# Patient Record
Sex: Male | Born: 1955 | Race: Black or African American | Hispanic: No | Marital: Married | Smoking: Never smoker
Health system: Southern US, Community
[De-identification: ages and names within clinical notes are randomized; demographics above are authoritative.]

## PROBLEM LIST (undated history)

## (undated) ENCOUNTER — Emergency Department (HOSPITAL_COMMUNITY): Admission: EM | Payer: Self-pay | Source: Home / Self Care

---

## 1998-08-27 ENCOUNTER — Encounter: Payer: Self-pay | Admitting: Emergency Medicine

## 1998-08-27 ENCOUNTER — Emergency Department (HOSPITAL_COMMUNITY): Admission: EM | Admit: 1998-08-27 | Discharge: 1998-08-27 | Payer: Self-pay | Admitting: Emergency Medicine

## 1998-08-29 ENCOUNTER — Emergency Department (HOSPITAL_COMMUNITY): Admission: EM | Admit: 1998-08-29 | Discharge: 1998-08-29 | Payer: Self-pay

## 1998-09-05 ENCOUNTER — Emergency Department (HOSPITAL_COMMUNITY): Admission: EM | Admit: 1998-09-05 | Discharge: 1998-09-05 | Payer: Self-pay | Admitting: Emergency Medicine

## 2009-01-07 ENCOUNTER — Emergency Department (HOSPITAL_COMMUNITY): Admission: EM | Admit: 2009-01-07 | Discharge: 2009-01-07 | Payer: Self-pay | Admitting: Emergency Medicine

## 2009-01-18 ENCOUNTER — Encounter: Admission: RE | Admit: 2009-01-18 | Discharge: 2009-01-18 | Payer: Self-pay | Admitting: Chiropractic Medicine

## 2009-04-18 ENCOUNTER — Ambulatory Visit (HOSPITAL_COMMUNITY): Admission: RE | Admit: 2009-04-18 | Discharge: 2009-04-18 | Payer: Self-pay | Admitting: Chiropractic Medicine
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
## 2014-09-23 ENCOUNTER — Encounter (HOSPITAL_COMMUNITY): Payer: Self-pay | Admitting: *Deleted

## 2014-09-23 ENCOUNTER — Emergency Department (HOSPITAL_COMMUNITY)
Admission: EM | Admit: 2014-09-23 | Discharge: 2014-09-23 | Disposition: A | Discharge status: Home / Self Care | Payer: Self-pay | Attending: Emergency Medicine | Admitting: Emergency Medicine

## 2014-09-23 DIAGNOSIS — Y92410 Unspecified street and highway as the place of occurrence of the external cause: Secondary | ICD-10-CM | POA: Insufficient documentation

## 2014-09-23 DIAGNOSIS — Y9389 Activity, other specified: Secondary | ICD-10-CM | POA: Insufficient documentation

## 2014-09-23 DIAGNOSIS — S8992XA Unspecified injury of left lower leg, initial encounter: Secondary | ICD-10-CM | POA: Insufficient documentation

## 2014-09-23 DIAGNOSIS — S0990XA Unspecified injury of head, initial encounter: Secondary | ICD-10-CM | POA: Insufficient documentation

## 2014-09-23 DIAGNOSIS — Y998 Other external cause status: Secondary | ICD-10-CM | POA: Insufficient documentation

## 2014-09-23 DIAGNOSIS — M25462 Effusion, left knee: Secondary | ICD-10-CM

## 2014-09-23 MED ORDER — METHOCARBAMOL 500 MG PO TABS: 500.0000 mg | ORAL_TABLET | Freq: Two times a day (BID) | ORAL | Status: AC

## 2014-09-23 MED ORDER — NAPROXEN 500 MG PO TABS
500.0000 mg | ORAL_TABLET | Freq: Once | ORAL | Status: AC
  Administered 2014-09-23: 500 mg via ORAL
  Filled 2014-09-23: qty 1

## 2014-09-23 MED ORDER — NAPROXEN 500 MG PO TABS: 500.0000 mg | ORAL_TABLET | Freq: Two times a day (BID) | ORAL | Status: AC

## 2014-09-23 NOTE — Discharge Instructions (Signed)
Knee Effusion Follow up with a primary care provider using the resource guide below.  Rest, Ice, and elevate.  The medical term for having fluid in your knee is effusion. This is often due to an internal derangement of the knee. This means something is wrong inside the knee. Some of the causes of fluid in the knee may be torn cartilage, a torn ligament, or bleeding into the joint from an injury. Your knee is likely more difficult to bend and move. This is often because there is increased pain and pressure in the joint. The time it takes for recovery from a knee effusion depends on different factors, including:   Type of injury.  Your age.  Physical and medical conditions.  Rehabilitation Strategies. How long you will be away from your normal activities will depend on what kind of knee problem you have and how much damage is present. Your knee has two types of cartilage. Articular cartilage covers the bone ends and lets your knee bend and move smoothly. Two menisci, thick pads of cartilage that form a rim inside the joint, help absorb shock and stabilize your knee. Ligaments bind the bones together and support your knee joint. Muscles move the joint, help support your knee, and take stress off the joint itself. CAUSES  Often an effusion in the knee is caused by an injury to one of the menisci. This is often a tear in the cartilage. Recovery after a meniscus injury depends on how much meniscus is damaged and whether you have damaged other knee tissue. Small tears may heal on their own with conservative treatment. Conservative means rest, limited weight bearing activity and muscle strengthening exercises. Your recovery may take up to 6 weeks.  TREATMENT  Larger tears may require surgery. Meniscus injuries may be treated during arthroscopy. Arthroscopy is a procedure in which your surgeon uses a small telescope like instrument to look in your knee. Your caregiver can make a more accurate diagnosis  (learning what is wrong) by performing an arthroscopic procedure. If your injury is on the inner margin of the meniscus, your surgeon may trim the meniscus back to a smooth rim. In other cases your surgeon will try to repair a damaged meniscus with stitches (sutures). This may make rehabilitation take longer, but may provide better long term result by helping your knee keep its shock absorption capabilities. Ligaments which are completely torn usually require surgery for repair. HOME CARE INSTRUCTIONS  Use crutches as instructed.  If a brace is applied, use as directed.  Once you are home, an ice pack applied to your swollen knee may help with discomfort and help decrease swelling.  Keep your knee raised (elevated) when you are not up and around or on crutches.  Only take over-the-counter or prescription medicines for pain, discomfort, or fever as directed by your caregiver.  Your caregivers will help with instructions for rehabilitation of your knee. This often includes strengthening exercises.  You may resume a normal diet and activities as directed. SEEK MEDICAL CARE IF:   There is increased swelling in your knee.  You notice redness, swelling, or increasing pain in your knee.  An unexplained oral temperature above 102 F (38.9 C) develops. SEEK IMMEDIATE MEDICAL CARE IF:   You develop a rash.  You have difficulty breathing.  You have any allergic reactions from medications you may have been given.  There is severe pain with any motion of the knee. MAKE SURE YOU:   Understand these instructions.  Will  watch your condition.  Will get help right away if you are not doing well or get worse. Document Released: 04/14/2003 Document Revised: 04/16/2011 Document Reviewed: 06/18/2007 [facility] Patient Information 2015 [place], [place]. This information is not intended to replace advice given to you by your health care provider. Make sure you discuss any questions you have with your  health care provider. Emergency Department Resource Guide 1) Find a Doctor and Pay Out of Pocket Although you won't have to find out who is covered by your insurance plan, it is a good idea to ask around and get recommendations. You will then need to call the office and see if the doctor you have chosen will accept you as a new patient and what types of options they offer for patients who are self-pay. Some doctors offer discounts or will set up payment plans for their patients who do not have insurance, but you will need to ask so you aren't surprised when you get to your appointment.  2) Contact Your Local Health Department Not all health departments have doctors that can see patients for sick visits, but many do, so it is worth a call to see if yours does. If you don't know where your local health department is, you can check in your phone book. The CDC also has a tool to help you locate your state's health department, and many state websites also have listings of all of their local health departments.  3) Find a Walk-in Clinic If your illness is not likely to be very severe or complicated, you may want to try a walk in clinic. These are popping up all over the country in pharmacies, drugstores, and shopping centers. They're usually staffed by nurse practitioners or physician assistants that have been trained to treat common illnesses and complaints. They're usually fairly quick and inexpensive. However, if you have serious medical issues or chronic medical problems, these are probably not your best option.  No Primary Care Doctor: - Call Health Connect at  [phone] - they can help you locate a primary care doctor that  accepts your insurance, provides certain services, etc. - Physician Referral Service- [phone]  Chronic Pain Problems: Organization         Address  Phone   Notes  [facility]  [phone] Patients need to be referred by their primary care doctor.    Medication Assistance: Organization         Address  Phone   Notes  [facility] Medication [facility] [address] [place] [place], [place] [address] [phone] --Must be a resident of [facility] -- Must have NO insurance coverage whatsoever (no Medicaid/ Medicare, etc.) -- The pt. MUST have a primary care doctor that directs their care regularly and follows them in the community   [facility]  [phone]   [employer]  [phone]    Agencies that provide inexpensive medical care: Organization         Address  Phone   Notes  [facility]  [phone]   [facility]    [phone]   [facility] [address] [place], [place] [address] [phone]   [facility] [address] [place], [place] [phone]   Planned Parenthood    [phone]   [facility]    [phone]   [facility]  [address] [place], [employer] Phone:  [phone], Fax:  [phone] Hours of Operation:  9 am - 6 pm, M-F.  Also accepts Medicaid/Medicare and self-pay.  [facility]  [address], [place] Phone: [phone], Fax: [phone]. Hours of Operation:  8:30 am - 5:30 pm, M-F.  Also accepts Medicaid and self-pay.  [facility] [place] [address] [place] Phone: [phone]   [facility] [address] Natasha Bence [place], [place] [phone], Ext. [phone] Mondays & Thursdays: 7-9 AM.  First 15 patients are seen on a first come, first serve basis.    Medicaid-accepting [facility] Providers:  Organization         Address  Phone   Notes  [facility] [address], [place] [phone] Also accepts self-pay patients.  [facility] [address] Laurell Josephs [place], [place]  [phone]   [facility] [address], [place] [phone]   [facility] [address], [place] [phone]   Renaye Rakers [address], [place]   [phone] Only accepts [place] patients after they have their name applied to their card.   Self-Pay (no insurance) in [facility]:  Organization         Address  Phone   Notes  Sickle Cell Patients, [facility] [address] [place], [place] [phone]   [facility] [address] [place], [place] [phone]   [facility] [place]  [address], [place] [phone]   [facility]/Dr. Osei-Bonsu  [address], [place] or [address], [place] [phone] Phone number for both [place] and [place] locations is the same.  [facility]gent Medical and [facility] [address], [place] [phone]   [facility] [address] [place] or [address] [phone]   [facility] [address] [place] [phone], phone; [phone], fax Sees patients 1st and 3rd Saturday of every month.  Must not qualify for public or private insurance (i.e. Medicaid, Medicare, [place] Health Choice, Veterans' Benefits)  Household income should be no more than 200% of the poverty level The clinic cannot treat you if you are pregnant or think you are pregnant  Sexually transmitted diseases are not treated at the clinic.    Dental Care: Organization         Address  Phone  Notes  [facility] Department of [facility] [address] [place], [place] [phone] Accepts children up to age 79 who are enrolled in [place] or [place] Health Choice; pregnant women with a Medicaid card; and children who have applied for Medicaid or [place] Health Choice, but were declined, whose parents can pay a reduced fee at time of service.    [facility] Department of [facility]  [address], [place] [phone] Accepts children up to age 19 who are enrolled in [place] or [place] Health Choice; pregnant women with a Medicaid card; and children who have applied for Medicaid or [place] Health Choice, but were declined, whose parents can pay a reduced fee at time of service.  [facility]  [address] [place], [place] [phone] Patients are seen by appointment only.  Walk-ins are not accepted. [facility] will see patients 59 years of age and older. Monday - Tuesday (8am-5pm) Most Wednesdays (8:30-5pm) $30 per visit, cash only  [facility] Adult Dental Access PROGRAM  [address] [facility] [phone] Patients are seen by appointment only. Walk-ins are not accepted. [facility] will see patients 24 years of age and older. One Wednesday Evening (Monthly: Volunteer Based).  $30 per visit, cash only  [employer]  [phone] for adults; Children under age 41, call Graduate Pediatric Dentistry at [phone]. Children aged 47-14, please call [phone] to request a pediatric application.  Dental services are provided in all areas of dental care including fillings, crowns and bridges, complete and partial dentures, implants, gum treatment, root canals, and extractions. Preventive care is also provided. Treatment is provided to both adults and children. Patients are selected via a lottery and there is often a waiting list.   [facility] [address], [place]  [phone] [email or web address]   [facility] [address] [place], [place] [phone], Ext. [phone] Second and Fourth Thursday of each month, opens at 6:30 AM; Clinic ends at 9 AM.  Patients are seen on a first-come first-served basis, and a limited number are seen during each clinic.   [facility]  [address] Ether Griffins [place], [place] [phone]   Eligibility Requirements You must have lived in [place], [place], or [place] counties for at least the last three months.   You cannot be eligible for state or federal sponsored [place], including [employer], [place], or [employer].   You generally cannot be eligible for healthcare insurance through your employer.    How to apply: Eligibility screenings are held every Tuesday and Wednesday afternoon from 1:00 pm until 4:00 pm. You do not need an appointment for the interview!  [facility] [address], [place], [place] [phone]   [facility] Health Department  [phone]   [facility] Health Department  [phone]   [facility] Health Department  [phone]    Behavioral Health Resources in the Community: Intensive Outpatient Programs Organization         Address  Phone  Notes  [facility] [address], [place], [place] [phone]   [facility] Outpatient [address], [place], [place] [phone]   [facility] [address] [place], [place]  [phone]   [facility] Mental Health [address],  [place], [place] [phone] or [phone]   Substance Abuse Resources Organization         Address  Phone  Notes  [facility]  [phone]   [facility]  [phone]   [facility]  [phone]   Floydene Flock  [phone]   Residential & Outpatient Substance Abuse Program  [phone]   Psychological Services Organization         Address  Phone  Notes  [facility] Behavioral Health  [phone]   [facility]  [phone]   [facility] Mental Health [address] [place] [phone] or [phone]    Mobile Crisis Teams Organization         Address  Phone  Notes  [facility], Mobile Crisis Care Unit  [phone]   [facility]  [address] [place], [place] [phone]   [facility] [address] [facility] [place] [phone]  Self-Help/Support Groups Organization         Address  Phone             Notes  [facility] [place] - variety of support groups  [place] Call for more information  Narcotics Anonymous (NA), [facility] [address], [employer] [place]  2 meetings at this location   Special educational needs teacher         Address  Phone  Notes  [facility] [place],    [place]  [phone]   [facility]  [address] [place] [phone], [place], [place]   [place], [place] [phone] Admissions: 8am-3pm M-F  Incentives Substance [place] [address]    [place], [place] [phone]   [facility] [address] [place], [place], [place]   [facility] [address]  [place], [place]   [facility] - Intensive Outpatient [address], [place], [place]   [facility] ([place]  [place], [place] [phone] or [phone]   [facility] ([facility]) [address] [place], [place] Accepts Medicaid  [facility] [address],  [place] [phone] Substance Abuse/Addiction Treatment   [facility] Organization         Address  Phone  Notes  [facility]  [phone]   Domenic Schwab, PhD [address] Arlis Porta [place], [place]   [phone] or [phone]   [place], [place] [phone]   [facility] [address], [place], [place] [phone] Insurance/Medicaid/sponsorship through [facility] and Families [address] [place]                                    [place], [place] [phone]  [place] [address], [place] [phone]    Dr. Adele Schilder  [phone]   [facility] Dept. 1) [address], [place] 2) [place] 3)  [address], [place] [phone]  [phone]   [place] [phone] or [phone] (After Hours)
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
## 2014-10-07 ENCOUNTER — Ambulatory Visit: Payer: Self-pay | Attending: Orthopaedic Surgery

## 2014-10-07 DIAGNOSIS — M542 Cervicalgia: Secondary | ICD-10-CM | POA: Insufficient documentation

## 2014-10-07 DIAGNOSIS — R293 Abnormal posture: Secondary | ICD-10-CM | POA: Insufficient documentation

## 2014-10-07 DIAGNOSIS — M6248 Contracture of muscle, other site: Secondary | ICD-10-CM | POA: Insufficient documentation

## 2014-10-07 DIAGNOSIS — M62838 Other muscle spasm: Secondary | ICD-10-CM

## 2014-10-07 DIAGNOSIS — M436 Torticollis: Secondary | ICD-10-CM | POA: Insufficient documentation

## 2014-10-07 DIAGNOSIS — M25562 Pain in left knee: Secondary | ICD-10-CM | POA: Insufficient documentation
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
## 2014-10-07 NOTE — Patient Instructions (Signed)

## 2014-10-13 ENCOUNTER — Ambulatory Visit: Payer: Self-pay

## 2014-10-13 DIAGNOSIS — M62838 Other muscle spasm: Secondary | ICD-10-CM

## 2014-10-13 DIAGNOSIS — M436 Torticollis: Secondary | ICD-10-CM

## 2014-10-13 DIAGNOSIS — M542 Cervicalgia: Secondary | ICD-10-CM

## 2014-10-13 DIAGNOSIS — M25562 Pain in left knee: Secondary | ICD-10-CM

## 2014-10-13 NOTE — Therapy (Signed)
[facility] Outpatient Rehabilitation [facility] [address] [place], [place], [address] Phone: [phone]   Fax:  [phone]  Physical Therapy Treatment  Patient Details  Name: Gene Glover MRN: [id number] Date of Birth: Aug 14, 1955 Referring Provider:  Kathryne Hitch*  Encounter Date: 10/13/2014      PT End of Session - 10/13/14 1327    Visit Number 2   Number of Visits 16   Date for PT Re-Evaluation 11/25/14   PT Start Time 1220   PT Stop Time 1308   PT Time Calculation (min) 48 min   Activity Tolerance Patient tolerated treatment well   Behavior During Therapy [facility] for tasks assessed/performed      No past medical history on file.  No past surgical history on file.  There were no vitals filed for this visit.  Visit Diagnosis:  Neck pain, acute  Knee pain, acute, left  Stiffness of neck  Muscle spasms of neck      Subjective Assessment - 10/13/14 1224    Subjective (p) I am in less pain than last time. Drove over 1.5 hours with less burning in thigh.    Currently in Pain? (p) Yes   Pain Score (p) 2    Pain Location (p) Knee   Pain Orientation (p) Left   Pain Descriptors / Indicators (p) Burning   Pain Type (p) Acute pain   Pain Score (p) 2   Pain Location (p) Neck   Pain Orientation (p) Right;Left   Pain Descriptors / Indicators (p) Aching   Pain Type (p) Acute pain   Pain Onset (p) More than a month ago   Pain Frequency (p) Constant                         [facility] Adult PT Treatment/Exercise - 10/13/14 1218    Exercises   Exercises Neck;Knee/Hip   Moist Heat Therapy   Number Minutes Moist Heat 15 Minutes   Moist Heat Location Cervical   Electrical Stimulation   Electrical Stimulation Location --   Electrical Stimulation Action --   Electrical Stimulation Parameters --   Electrical Stimulation Goals --   Iontophoresis   Type of Iontophoresis Dexamethasone   Location LT knee proximal to patella   Dose  1cc   Time 4 hours or less if irritating   Manual Therapy   Manual Therapy Soft tissue mobilization;Passive ROM;Manual Traction;Taping   Soft tissue mobilization With rock blade black STW to neck bilaterally and to LT quad and with cross friction and pressure to distal quad in area of most tenderness.  He reports feeling better after   McConnell @ peices attache at top of patella dn pulled distally. LT patella                  PT Short Term Goals - 10/07/14 1333    PT SHORT TERM GOAL #1   Title He will be independent with initial HEP   Time 4   Period Weeks   Status New   PT SHORT TERM GOAL #2   Title He will report pain decreased 40% or more in knee with sitting /driving   Time 4   Period Weeks   Status New   PT SHORT TERM GOAL #3   Title He will report neck pain improved 40% with normal activity in day   Time 4   Period Weeks   Status New   PT SHORT TERM GOAL #4  Title He will improve active neck rotation  to 60 degrees bilaterally   Time 4   Period Weeks   Status New   PT SHORT TERM GOAL #5   Title Demonstrate postural awareness   Time 4   Period Weeks   Status New           PT Long Term Goals - 10/07/14 1335    PT LONG TERM GOAL #1   Title He will be independent with alll HEP issued as of last visit   Time 8   Period Weeks   Status New   PT LONG TERM GOAL #2   Title He will report 75% or more decr pain in neck with work activity   Time 8   Period Weeks   Status New   PT LONG TERM GOAL #3   Title He will report 75% decre pain in knee with driving   Time 8   Period Weeks   Status New   PT LONG TERM GOAL #4   Title He will improve neck motion to WNL with 1-2 max pain.    Time 8   Period Weeks   Status New   PT LONG TERM GOAL #5   Title He will report no hjeadaches and no shooting pain in neck   Time 8   Period Weeks   Status New               Plan - 10/13/14 1328    Clinical Impression Statement Mr wall is much improved with  very low levels of pain. Today I addressed STW and modalitues and taping to LT patella.  He was given an ionto patch to apply tomorrow after tape removeal He wwas asked to remove tape if he had any irritation at all. .  Will start stab exercies and quad sets SLR nest visit   PT Next Visit Plan Start HEP with neck stretch and posture , cont modalities. manual    Consulted and Agree with Plan of Care Patient        Problem List There are no active problems to display for this patient.   Caprice Red PT 10/13/2014, 1:35 PM  [facility] [address] [place], [place], [address] Phone: [phone]   Fax:  [phone]

## 2014-10-20 ENCOUNTER — Ambulatory Visit: Payer: Self-pay

## 2014-10-20 DIAGNOSIS — M542 Cervicalgia: Secondary | ICD-10-CM

## 2014-10-20 DIAGNOSIS — M25562 Pain in left knee: Secondary | ICD-10-CM

## 2014-10-20 DIAGNOSIS — M436 Torticollis: Secondary | ICD-10-CM

## 2014-10-20 DIAGNOSIS — M62838 Other muscle spasm: Secondary | ICD-10-CM

## 2014-10-20 NOTE — Patient Instructions (Signed)
Straight Leg Raise   Tighten stomach and slowly raise locked right leg _12-24___ inches from floor. Repeat _10-25___ times per set. Do _1-2___ sets per session. Do __1-2__ sessions per day.  http://orth.exer.us/1102   Copyright  VHI. All rights reserved.  Quad Set   With other leg bent, foot flat, slowly tighten muscles on thigh of straight leg while counting  to 10____. Marland Kitchen Repeat _10-25___ times. Do _2-10___ sessions per day.  http://gt2.exer.us/275   Copyright  VHI. All rights reserved.

## 2014-10-20 NOTE — Therapy (Signed)
[facility] Outpatient Rehabilitation [facility] [address] [place], [place], [address] Phone: [phone]   Fax:  [phone]  Physical Therapy Treatment  Patient Details  Name: Gene Glover MRN: [id number] Date of Birth: Jun 20, 1955 Referring Provider:  Kathryne Hitch*  Encounter Date: 10/20/2014      PT End of Session - 10/20/14 1306    Visit Number 3   Number of Visits 16   Date for PT Re-Evaluation 11/25/14   PT Start Time 1232   PT Stop Time 1322   PT Time Calculation (min) 50 min   Activity Tolerance Patient tolerated treatment well   Behavior During Therapy [facility] for tasks assessed/performed      No past medical history on file.  No past surgical history on file.  There were no vitals filed for this visit.  Visit Diagnosis:  Neck pain, acute  Knee pain, acute, left  Stiffness of neck  Muscle spasms of neck      Subjective Assessment - 10/20/14 1233    Subjective Much better. Drove from [place] today with no burning in knee. Neck is stiff and with twisting less creptus. Afraid to use LT leg on stairs.    Currently in Pain? Yes   Pain Score 1    Pain Location Neck  mostly stiffness   Pain Orientation Right;Left;Posterior   Pain Type --  sub acute   Pain Onset More than a month ago   Pain Frequency Constant   Aggravating Factors  walking , driving   Pain Relieving Factors meds   Multiple Pain Sites Yes   Pain Score 2   Pain Location Neck   Pain Orientation Left   Pain Descriptors / Indicators Aching  stiff   Pain Type --  sub acute   Pain Onset More than a month ago   Pain Frequency Constant   Aggravating Factors  walking   Pain Relieving Factors meds                         [facility] Adult PT Treatment/Exercise - 10/20/14 1237    Neck Exercises: Machines for Strengthening   UBE (Upper Arm Bike) L1 5 min   Knee/Hip Exercises: Seated   Other Seated Knee/Hip Exercises Quad sets 10 sec x 10 and SLR. x  10 reps without pain   Moist Heat Therapy   Number Minutes Moist Heat 15 Minutes   Moist Heat Location Cervical   Iontophoresis   Type of Iontophoresis Dexamethasone   Location LT knee proximal to patella   Dose 1cc   Time 4 hours or less if irritating   Manual Therapy   Soft tissue mobilization With rock blade black STW to neck bilaterally and to LT quad and with cross friction and pressure to distal quad in area of most tenderness.  He reports feeling better after   McConnell 2 peices attache at top of patella dn pulled distally. LT patella                PT Education - 10/20/14 1306    Education provided Yes   Education Details quad exercises   Person(s) Educated Patient   Methods Explanation;Demonstration;Tactile cues;Verbal cues;Handout   Comprehension Returned demonstration          PT Short Term Goals - 10/20/14 1311    PT SHORT TERM GOAL #1   Title He will be independent with initial HEP   Status Achieved   PT SHORT TERM GOAL #  2   Title He will report pain decreased 40% or more in knee with sitting /driving   Status Achieved   PT SHORT TERM GOAL #3   Title He will report neck pain improved 40% with normal activity in day   Status Achieved   PT SHORT TERM GOAL #4   Title He will improve active neck rotation  to 60 degrees bilaterally   Status On-going   PT SHORT TERM GOAL #5   Title Demonstrate postural awareness   Status Achieved           PT Long Term Goals - 10/07/14 1335    PT LONG TERM GOAL #1   Title He will be independent with alll HEP issued as of last visit   Time 8   Period Weeks   Status New   PT LONG TERM GOAL #2   Title He will report 75% or more decr pain in neck with work activity   Time 8   Period Weeks   Status New   PT LONG TERM GOAL #3   Title He will report 75% decre pain in knee with driving   Time 8   Period Weeks   Status New   PT LONG TERM GOAL #4   Title He will improve neck motion to WNL with 1-2 max pain.     Time 8   Period Weeks   Status New   PT LONG TERM GOAL #5   Title He will report no hjeadaches and no shooting pain in neck   Time 8   Period Weeks   Status New               Plan - 10/20/14 1307    Clinical Impression Statement [name] continues to improve. Erie Noe is reluctant to load LT leg so we started some quad exercise . Very basic quad set/SLR.  We will start some cervical stab exercises next visit if no worse.    PT Next Visit Plan Continue modalities, quad /leg exercises and start cervical stab exer   PT Home Exercise Plan QS and SLR   Consulted and Agree with Plan of Care Patient        Problem List There are no active problems to display for this patient.   Caprice Red PT 10/20/2014, 1:13 PM  [facility] [address] [place], [place], [address] Phone: [phone]   Fax:  [phone]

## 2014-10-22 ENCOUNTER — Ambulatory Visit: Payer: Self-pay | Admitting: Physical Therapy

## 2014-10-22 DIAGNOSIS — M436 Torticollis: Secondary | ICD-10-CM

## 2014-10-22 DIAGNOSIS — M25562 Pain in left knee: Secondary | ICD-10-CM

## 2014-10-22 DIAGNOSIS — R293 Abnormal posture: Secondary | ICD-10-CM

## 2014-10-22 DIAGNOSIS — M62838 Other muscle spasm: Secondary | ICD-10-CM

## 2014-10-22 DIAGNOSIS — M542 Cervicalgia: Secondary | ICD-10-CM

## 2014-10-22 NOTE — Therapy (Signed)
[facility] Outpatient Rehabilitation [facility] [address] [place], [place], [address] Phone: [phone]   Fax:  [phone]  Physical Therapy Treatment  Patient Details  Name: Gene Glover MRN: [id number] Date of Birth: 01/18/1956 Referring Provider:  Kathryne Hitch*  Encounter Date: 10/22/2014      PT End of Session - 10/22/14 1029    Visit Number 4   Number of Visits 16   Date for PT Re-Evaluation 11/25/14   PT Start Time 1011   PT Stop Time 1112   PT Time Calculation (min) 61 min      No past medical history on file.  No past surgical history on file.  There were no vitals filed for this visit.  Visit Diagnosis:  Neck pain, acute  Knee pain, acute, left  Stiffness of neck  Muscle spasms of neck  Abnormal posture      Subjective Assessment - 10/22/14 1017    Subjective No neck pain, knee feels weak. New Low back pain when I bend over.             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 desk work    Neck Exercises: Machines for Strengthening   UBE (Upper Arm Bike) L 1.5 4 min    Neck Exercises: Theraband   Rows 10 reps;Red   Rows Limitations felt pulling in upper traps so did not add to hep   Neck Exercises: Seated   Neck Retraction 10 reps   Other Seated Exercise scap squeezes x 10 5 sec hold   Neck Exercises: Supine   Neck Retraction 10 reps;5 secs   Knee/Hip Exercises: Stretches   Other Knee/Hip Stretches knee to chest stretch 3 x 30  decreased ROM left   Knee/Hip Exercises: Aerobic   Nustep L4 LE x 5 min   Knee/Hip Exercises: Seated   Knee/Hip Flexion x 10 each    Knee/Hip Exercises: Supine   Quad Sets Left;10 reps   Short Arc Quad Sets 15 reps   Heel  Slides 10 reps   Straight Leg Raises Left;10 reps   Moist Heat Therapy   Number Minutes Moist Heat 15 Minutes   Moist Heat Location Cervical   Iontophoresis   Type of Iontophoresis Dexamethasone   Location LT knee proximal to patella   Dose 1cc   Time 4-6 hours                PT Education - 10/22/14 1055    Education provided Yes   Education Details chin tuck seated, scap squeezes   Person(s) Educated Patient   Methods Explanation;Handout   Comprehension Verbalized understanding          PT Short Term Goals - 10/20/14 1311    PT SHORT TERM GOAL #1   Title He will be independent with initial HEP   Status Achieved   PT SHORT TERM GOAL #2   Title He will report pain decreased 40% or more in knee with sitting /driving   Status Achieved   PT SHORT TERM GOAL #3   Title He will report neck pain improved 40% with normal activity in day  Status Achieved   PT SHORT TERM GOAL #4   Title He will improve active neck rotation  to 60 degrees bilaterally   Status On-going   PT SHORT TERM GOAL #5   Title Demonstrate postural awareness   Status Achieved           PT Long Term Goals - 10/07/14 1335    PT LONG TERM GOAL #1   Title He will be independent with alll HEP issued as of last visit   Time 8   Period Weeks   Status New   PT LONG TERM GOAL #2   Title He will report 75% or more decr pain in neck with work activity   Time 8   Period Weeks   Status New   PT LONG TERM GOAL #3   Title He will report 75% decre pain in knee with driving   Time 8   Period Weeks   Status New   PT LONG TERM GOAL #4   Title He will improve neck motion to WNL with 1-2 max pain.    Time 8   Period Weeks   Status New   PT LONG TERM GOAL #5   Title He will report no hjeadaches and no shooting pain in neck   Time 8   Period Weeks   Status New               Plan - 10/22/14 1105    Clinical Impression Statement Pt presents with overall less pain neck and knee however  reports new onset of LBP. He reports slouched posture at desk all day. Pt given postural exercises for HEP -chin tuck, scap squeezes as well as postural education. Continued left knee/hip strength. Pt demonstrates decreased left hip flexion ROM compared to right. Issued knee to chest stretch as well for HEP   PT Next Visit Plan Continue modalities, quad /leg exercises and review and progress cervical stab exer as tolerated        Problem List There are no active problems to display for this patient.   Sherrie Mustache, [place] 10/22/2014, 11:08 AM  [facility] [address] [place], [place], [address] Phone: [phone]   Fax:  [phone]

## 2014-10-22 NOTE — Patient Instructions (Addendum)
Knee to Chest   Lying supine, bend involved knee to chest _3__ times. Hold 30 sec each time. Repeat with other leg. Do _2__ times per day.  Copyright  VHI. All rights reserved.  Axial Extension (Chin Tuck)   Pull chin in and lengthen back of neck. Hold __5__ seconds while counting out loud. Repeat ___10_ times. Do _2___ sessions per day.  http://gt2.exer.us/449   Copyright  VHI. All rights reserved.  Shoulder Blade Squeeze   Rotate shoulders back, then squeeze shoulder blades together. HOLD 5 sec Repeat _10___ times. Do _2___ sessions per day.  http://gt2.exer.us/846   Copyright  VHI. All rights reserved.

## 2014-10-26 ENCOUNTER — Ambulatory Visit: Payer: Self-pay

## 2014-10-26 DIAGNOSIS — M542 Cervicalgia: Secondary | ICD-10-CM

## 2014-10-26 DIAGNOSIS — M25562 Pain in left knee: Secondary | ICD-10-CM

## 2014-10-26 DIAGNOSIS — M436 Torticollis: Secondary | ICD-10-CM

## 2014-10-26 DIAGNOSIS — M62838 Other muscle spasm: Secondary | ICD-10-CM

## 2014-10-26 NOTE — Therapy (Signed)
[facility] [address] [place], [place], [address] Phone: [phone]   Fax:  [phone]  Physical Therapy Treatment  Patient Details  Name: Gene Glover MRN: [id number] Date of Birth: Aug 26, 1955 Referring Provider:  Kathryne Hitch*  Encounter Date: 10/26/2014      PT End of Session - 10/26/14 1304    Visit Number 5   Number of Visits 16   Date for PT Re-Evaluation 11/25/14   PT Start Time 1230   PT Stop Time 1323   PT Time Calculation (min) 53 min   Activity Tolerance Patient tolerated treatment well   Behavior During Therapy [facility] for tasks assessed/performed      No past medical history on file.  No past surgical history on file.  There were no vitals filed for this visit.  Visit Diagnosis:  Neck pain, acute  Knee pain, acute, left  Stiffness of neck  Muscle spasms of neck      Subjective Assessment - 10/26/14 1234    Subjective Leg feels stronger. No neck pain. Headache today.     Currently in Pain? Yes   Pain Score --  moderate   Pain Location Hand   Pain Orientation Right;Left   Pain Descriptors / Indicators Aching   Pain Type Acute pain   Pain Onset In the past 7 days   Pain Frequency Constant   Multiple Pain Sites No                         [facility] Adult PT Treatment/Exercise - 10/26/14 1236    Neck Exercises: Machines for Strengthening   UBE (Upper Arm Bike) 90 RPM 5 miin   Knee/Hip Exercises: Stretches   Other Knee/Hip Stretches knee to chest stretch 3 x 30  He reports no burning in distal quad, pressure  anterior hip   Knee/Hip Exercises: Supine   Short Arc [employer] 15 reps   Short Arc Quad Sets Limitations 5 pounds, 5 sec hold   Straight Leg Raises Left;15 reps   Moist Heat Therapy   Number Minutes Moist Heat 15 Minutes   Moist Heat Location Cervical   Manual Therapy   Soft tissue mobilization STW PA mobs Gr 2-3, manual traction and sub occipital release   for headache                  PT Short Term Goals - 10/20/14 1311    PT SHORT TERM GOAL #1   Title He will be independent with initial HEP   Status Achieved   PT SHORT TERM GOAL #2   Title He will report pain decreased 40% or more in knee with sitting /driving   Status Achieved   PT SHORT TERM GOAL #3   Title He will report neck pain improved 40% with normal activity in day   Status Achieved   PT SHORT TERM GOAL #4   Title He will improve active neck rotation  to 60 degrees bilaterally   Status On-going   PT SHORT TERM GOAL #5   Title Demonstrate postural awareness   Status Achieved           PT Long Term Goals - 10/07/14 1335    PT LONG TERM GOAL #1   Title He will be independent with alll HEP issued as of last visit   Time 8   Period Weeks   Status New   PT LONG TERM GOAL #2   Title  He will report 75% or more decr pain in neck with work activity   Time 8   Period Weeks   Status New   PT LONG TERM GOAL #3   Title He will report 75% decre pain in knee with driving   Time 8   Period Weeks   Status New   PT LONG TERM GOAL #4   Title He will improve neck motion to WNL with 1-2 max pain.    Time 8   Period Weeks   Status New   PT LONG TERM GOAL #5   Title He will report no hjeadaches and no shooting pain in neck   Time 8   Period Weeks   Status New               Plan - 10/26/14 1305    Clinical Impression Statement Headache eliminated at end of session . This limited UE exercise.  He was able to do the SAQ with more weight and SLR. He is improving .  Continue exercies for cervical stab and LT strength next visit   PT Next Visit Plan Continue modalities, quad /leg exercises and review and progress cervical stab exer as tolerated   Consulted and Agree with Plan of Care Patient        Problem List There are no active problems to display for this patient.   Caprice Red PT 10/26/2014, 2:24 PM  [facility] [address] [place], [place], [address] Phone: [phone]   Fax:  [phone]

## 2014-10-28 ENCOUNTER — Ambulatory Visit: Payer: Self-pay

## 2014-10-28 DIAGNOSIS — R293 Abnormal posture: Secondary | ICD-10-CM

## 2014-10-28 DIAGNOSIS — M25562 Pain in left knee: Secondary | ICD-10-CM

## 2014-10-28 DIAGNOSIS — M62838 Other muscle spasm: Secondary | ICD-10-CM

## 2014-10-28 DIAGNOSIS — M542 Cervicalgia: Secondary | ICD-10-CM

## 2014-10-28 DIAGNOSIS — M436 Torticollis: Secondary | ICD-10-CM

## 2014-10-28 NOTE — Patient Instructions (Signed)
Wall sits for ome from cabinet x15 3-10 sec hold, 15 to 30 reps 1x/day

## 2014-10-28 NOTE — Therapy (Signed)
[place], [place], [address] Phone: [phone]   Fax:  [phone]  Physical Therapy Treatment  Patient Details  Name: Gene Glover MRN: [id number] Date of Birth: 1955-08-07 Referring Provider:  Mcarthur Rossetti*  Encounter Date: 10/28/2014      PT End of Session - 10/28/14 1319    Visit Number 6   Number of Visits 16   Date for PT Re-Evaluation 11/25/14   PT Start Time 1230   PT Stop Time 1325   PT Time Calculation (min) 55 min   Activity Tolerance Patient tolerated treatment well   Behavior During Therapy [facility] for tasks assessed/performed      No past medical history on file.  No past surgical history on file.  There were no vitals filed for this visit.  Visit Diagnosis:  Knee pain, acute, left  Neck pain, acute  Stiffness of neck  Muscle spasms of neck  Abnormal posture          [facility] PT Assessment - 10/28/14 1246    AROM   Right Knee Extension 0   Right Knee Flexion 135   Left Knee Extension 0   Left Knee Flexion 135   Cervical Flexion 40   Cervical Extension 50   Cervical - Right Side Bend 45   Cervical - Left Side Bend 45   Cervical - Right Rotation 58   Cervical - Left Rotation 50   PROM   Overall PROM Comments No quad lag                     [facility] Adult PT Treatment/Exercise - 10/28/14 1252    Neck Exercises: Theraband   Shoulder External Rotation Limitations x12 red band    Horizontal ABduction Limitations 12 reps red band   Other Theraband Exercises and extension over thighs  x12 red band.   Knee/Hip Exercises: Aerobic   Nustep LE and UE x 7 min L5   Knee/Hip Exercises: Standing   Wall Squat 15 reps;3 seconds   Wall Squat Limitations short range. He reported some mild pain at end  but he was squatting deepeer   Knee/Hip Exercises: Supine   Short Arc Quad Sets 15 reps;2 sets   Short Arc Quad Sets Limitations 5   Straight Leg Raises  Left;15 reps   Moist Heat Therapy   Number Minutes Moist Heat 15 Minutes   Moist Heat Location Cervical    Manual PROM all planes neck  X 3 with 5-10 sec stretch            PT Education - 10/28/14 1318    Education provided Yes   Education Details wall sit   Person(s) Educated Patient   Methods Explanation;Verbal cues;Handout   Comprehension Verbalized understanding;Returned demonstration          PT Short Term Goals - 10/28/14 1322    PT SHORT TERM GOAL #1   Title He will be independent with initial HEP   Status Achieved   PT SHORT TERM GOAL #2   Title He will report pain decreased 40% or more in knee with sitting /driving   Status Achieved   PT SHORT TERM GOAL #3   Title He will report neck pain improved 40% with normal activity in day   Status Achieved   PT SHORT TERM GOAL #4   Title He will improve active neck rotation  to 60 degrees bilaterally   Status Partially Met  PT SHORT TERM GOAL #5   Title Demonstrate postural awareness   Status Achieved           PT Long Term Goals - 10/28/14 1323    PT LONG TERM GOAL #1   Title He will be independent with alll HEP issued as of last visit   Status On-going   PT LONG TERM GOAL #2   Title He will report 75% or more decr pain in neck with work activity   Status On-going   PT LONG TERM GOAL #3   Title He will report 75% decre pain in knee with driving   Status Achieved   PT LONG TERM GOAL #4   Title He will improve neck motion to WNL with 1-2 max pain.    Status On-going   PT LONG TERM GOAL #5   Title He will report no headaches and no shooting pain in neck   Status Achieved               Plan - 10/28/14 1319    Clinical Impression Statement Arlina Robes well with minor neck pain and no knee pain except with more strenuous exercise. We will progress load as tolerated    PT Next Visit Plan Continue modalities, quad /leg exercises and review and progress cervical stab exer as tolerated   PT Home Exercise  Plan wall sit   Consulted and Agree with Plan of Care Patient        Problem List There are no active problems to display for this patient.   Darrel Hoover PT 10/28/2014, 1:24 PM  [facility] [address] [place], [place], [address] Phone: [phone]   Fax:  [phone]

## 2014-11-02 ENCOUNTER — Ambulatory Visit: Payer: Self-pay

## 2014-11-02 DIAGNOSIS — M25562 Pain in left knee: Secondary | ICD-10-CM

## 2014-11-02 DIAGNOSIS — M542 Cervicalgia: Secondary | ICD-10-CM

## 2014-11-02 DIAGNOSIS — M436 Torticollis: Secondary | ICD-10-CM

## 2014-11-02 DIAGNOSIS — M62838 Other muscle spasm: Secondary | ICD-10-CM

## 2014-11-02 NOTE — Therapy (Signed)
[place], [place], [address] Phone: [phone]   Fax:  [phone]  Physical Therapy Treatment  Patient Details  Name: Gene Glover MRN: [id number] Date of Birth: 03-15-1955 Referring Provider:  Mcarthur Rossetti*  Encounter Date: 11/02/2014      PT End of Session - 11/02/14 1320    Visit Number 7   Number of Visits 16   Date for PT Re-Evaluation 11/25/14   PT Start Time 1230   PT Stop Time 1315   PT Time Calculation (min) 45 min   Activity Tolerance Patient tolerated treatment well   Behavior During Therapy [facility] for tasks assessed/performed      No past medical history on file.  No past surgical history on file.  There were no vitals filed for this visit.  Visit Diagnosis:  Knee pain, acute, left  Neck pain, acute  Stiffness of neck  Muscle spasms of neck      Subjective Assessment - 11/02/14 1317    Subjective no pain today. MD feels I am doing better and will see me in a month   Currently in Pain? No/denies                         [facility] Adult PT Treatment/Exercise - 11/02/14 1232    Neck Exercises: Supine   Other Supine Exercise stabilization with red band with chin tuck hor abduct/ abduct/ER/ exension.  12 reps each   Knee/Hip Exercises: Aerobic   Nustep LE and UE x 7 min L5   Knee/Hip Exercises: Standing   Forward Lunges Right;Left;10 reps   Forward Lunges Limitations static lunges in bars for support. LT knee forward he had carch proximal to medil superior LT patella.    Wall Squat 15 reps;3 seconds   Wall Squat Limitations short range. He reported some mild pain at end  but he was squatting deepeer   Knee/Hip Exercises: Supine   Short Arc Quad Sets Strengthening;Left   Short Arc Quad Sets Limitations 5 ppounds 25 reps   Straight Leg Raises Left;15 reps                PT Education - 11/02/14 1320    Education provided Yes   Education  Details cervical stab exercises   Person(s) Educated Patient   Methods Explanation;Tactile cues;Verbal cues;Handout   Comprehension Returned demonstration;Verbalized understanding          PT Short Term Goals - 10/28/14 1322    PT SHORT TERM GOAL #1   Title He will be independent with initial HEP   Status Achieved   PT SHORT TERM GOAL #2   Title He will report pain decreased 40% or more in knee with sitting /driving   Status Achieved   PT SHORT TERM GOAL #3   Title He will report neck pain improved 40% with normal activity in day   Status Achieved   PT SHORT TERM GOAL #4   Title He will improve active neck rotation  to 60 degrees bilaterally   Status Partially Met   PT SHORT TERM GOAL #5   Title Demonstrate postural awareness   Status Achieved           PT Long Term Goals - 11/02/14 1322    PT LONG TERM GOAL #1   Title He will be independent with alll HEP issued as of last visit   Status On-going   PT LONG TERM GOAL #2  Status On-going   PT LONG TERM GOAL #3   Title He will report 75% decre pain in knee with driving   Status Achieved   PT LONG TERM GOAL #4   Title He will improve neck motion to WNL with 1-2 max pain.    Status On-going   PT LONG TERM GOAL #5   Title He will report no headaches and no shooting pain in neck   Status Achieved               Plan - 11/02/14 1321    Clinical Impression Statement He continues to have some pain with loaded exercises with LT knee  located proximal to patella LT. Neck doing well with no pain with band exercises   PT Next Visit Plan Continue modalities, quad /leg exercises and review and progress cervical stab exer as tolerated   PT Home Exercise Plan band exercises for cervical stability   Consulted and Agree with Plan of Care Patient        Problem List There are no active problems to display for this patient.   Darrel Hoover PT 11/02/2014, 1:23 PM  [facility] [address] [place], [place], [address] Phone: [phone]   Fax:  [phone]

## 2014-11-02 NOTE — Patient Instructions (Signed)
Issued from cabnet  Unattached scapula exercises with band for stabilization exercises with chin tuck12-15 reps, 1x/day red band issued

## 2014-11-09 ENCOUNTER — Ambulatory Visit: Payer: Self-pay | Attending: Orthopaedic Surgery

## 2014-11-09 DIAGNOSIS — M25562 Pain in left knee: Secondary | ICD-10-CM | POA: Insufficient documentation

## 2014-11-09 DIAGNOSIS — R6889 Other general symptoms and signs: Secondary | ICD-10-CM | POA: Insufficient documentation

## 2014-11-09 DIAGNOSIS — M542 Cervicalgia: Secondary | ICD-10-CM | POA: Insufficient documentation

## 2014-11-09 DIAGNOSIS — M6248 Contracture of muscle, other site: Secondary | ICD-10-CM | POA: Insufficient documentation

## 2014-11-09 DIAGNOSIS — M436 Torticollis: Secondary | ICD-10-CM | POA: Insufficient documentation

## 2014-11-09 DIAGNOSIS — M62838 Other muscle spasm: Secondary | ICD-10-CM

## 2014-11-09 NOTE — Therapy (Signed)
[place], [place], [address] Phone: [phone]   Fax:  [phone]  Physical Therapy Treatment  Patient Details  Name: Gene Glover MRN: [id number] Date of Birth: 23-Nov-1955 Referring Provider:  Mcarthur Rossetti*  Encounter Date: 11/09/2014      PT End of Session - 11/09/14 1317    Visit Number 8   Number of Visits 16   Date for PT Re-Evaluation 11/25/14   PT Start Time 1230   PT Stop Time 1315   PT Time Calculation (min) 45 min   Activity Tolerance Patient tolerated treatment well   Behavior During Therapy [facility] for tasks assessed/performed      No past medical history on file.  No past surgical history on file.  There were no vitals filed for this visit.  Visit Diagnosis:  Knee pain, acute, left  Neck pain, acute  Stiffness of neck  Muscle spasms of neck      Subjective Assessment - 11/09/14 1237    Subjective No pain today   Limitations Sitting                         [facility] Adult PT Treatment/Exercise - 11/09/14 0001    Neuro Re-ed    Neuro Re-ed Details  stand Lt leg with  ball toss to rebounder   Knee/Hip Exercises: Aerobic   Nustep LE and UE x 7 min L7   Knee/Hip Exercises: Machines for Strengthening   Cybex Knee Extension 1 plate x10   Cybex Leg Press 3 plates x 20   Knee/Hip Exercises: Standing   Forward Lunges Limitations static lunges in bars for support. LT knee forward he did not have a catch proximal to medial superior LT patella.  today. He did 10 reps RT and LT  without pain .Marland Kitchen    Knee/Hip Exercises: Prone   Hamstring Curl 15 reps   Hamstring Curl Limitations 4 pounds.      Sit to stand exercise from mat with pillow working on hip hinge to facilitate pattern and strength             PT Short Term Goals - 10/28/14 1322    PT SHORT TERM GOAL #1   Title He will be independent with initial HEP   Status Achieved   PT SHORT TERM  GOAL #2   Title He will report pain decreased 40% or more in knee with sitting /driving   Status Achieved   PT SHORT TERM GOAL #3   Title He will report neck pain improved 40% with normal activity in day   Status Achieved   PT SHORT TERM GOAL #4   Title He will improve active neck rotation  to 60 degrees bilaterally   Status Partially Met   PT SHORT TERM GOAL #5   Title Demonstrate postural awareness   Status Achieved           PT Long Term Goals - 11/09/14 1320    Additional Long Term Goals   Additional Long Term Goals Yes   PT LONG TERM GOAL #6   Title He will be able to simulate wiork activity without pain   Time 4   Period Weeks   Status New               Plan - 11/09/14 1318    Clinical Impression Statement No pain with therapy today. He is tolerating more load without pain. We discussed  work activity and he felt he would benefit from work simulation activity   PT Next Visit Plan Continue strength and begin work simulation activity   Consulted and Agree with Plan of Care Patient        Problem List There are no active problems to display for this patient.   Darrel Hoover PT 11/09/2014, 1:21 PM  [facility] [address] [place], [place], [address] Phone: [phone]   Fax:  [phone]

## 2014-11-11 ENCOUNTER — Ambulatory Visit: Payer: Self-pay

## 2014-11-11 DIAGNOSIS — R6889 Other general symptoms and signs: Secondary | ICD-10-CM

## 2014-11-11 DIAGNOSIS — M25562 Pain in left knee: Secondary | ICD-10-CM

## 2014-11-11 DIAGNOSIS — M62838 Other muscle spasm: Secondary | ICD-10-CM

## 2014-11-11 DIAGNOSIS — M542 Cervicalgia: Secondary | ICD-10-CM

## 2014-11-11 NOTE — Therapy (Signed)
[place], [place], [address] Phone: [phone]   Fax:  [phone]  Physical Therapy Treatment  Patient Details  Name: Gene Glover MRN: [id number] Date of Birth: [birth year] Referring Provider:  Mcarthur Rossetti*  Encounter Date: 11/11/2014      PT End of Session - 11/11/14 1323    Visit Number 9   Number of Visits 16   Date for PT Re-Evaluation 11/25/14   PT Start Time 1230   PT Stop Time 1310   PT Time Calculation (min) 40 min   Activity Tolerance Patient tolerated treatment well   Behavior During Therapy [facility] for tasks assessed/performed      No past medical history on file.  No past surgical history on file.  There were no vitals filed for this visit.  Visit Diagnosis:  Knee pain, acute, left  Neck pain, acute  Muscle spasms of neck  Activity intolerance      Subjective Assessment - 11/11/14 1235    Subjective No complaints   Currently in Pain? 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 up to 40 pounds without pain . Carry one handed 11-24-28-35 pounds 50 feet  RT and LT handed   Knee/Hip Exercises: Aerobic   Nustep LE and UE x 7 min L8   Knee/Hip Exercises: Machines for Strengthening   Cybex Knee Extension 2 plates x30   Cybex Leg Press 3 plates x 30                  PT Short Term Goals - 10/28/14 1322    PT SHORT TERM GOAL #1   Title He will be independent with initial HEP   Status Achieved   PT SHORT TERM GOAL #2   Title He will report pain decreased 40% or more in knee with sitting /driving   Status Achieved   PT SHORT TERM GOAL #3   Title He will report neck pain improved 40% with normal activity in day   Status Achieved   PT SHORT TERM GOAL #4   Title He will improve active neck rotation  to 60 degrees bilaterally   Status Partially Met   PT SHORT TERM GOAL #5   Title Demonstrate postural awareness   Status Achieved           PT Long Term Goals - 11/11/14 1325    PT LONG TERM GOAL #6   Title He will be able to simulate wiork activity without pain   Status On-going               Plan - 11/11/14 1323    Clinical Impression Statement No pain. He was cautioned he may be sore as he has not liftied /carry in [place] . We will progress with lifting/carry   PT Next Visit Plan Continue strength and continue work simulation activity, add weight if not sore from today's activity   Consulted and Agree with Plan of Care Patient     FOTO NEXT VISIT   Problem List There are no active problems to display for this patient.   Darrel Hoover PT 11/11/2014, 1:27 PM  [place], [place], [address] Phone: [phone]   Fax:  [phone]

## 2014-11-15 ENCOUNTER — Ambulatory Visit: Payer: Self-pay

## 2014-11-15 DIAGNOSIS — M62838 Other muscle spasm: Secondary | ICD-10-CM

## 2014-11-15 DIAGNOSIS — M25562 Pain in left knee: Secondary | ICD-10-CM

## 2014-11-15 DIAGNOSIS — M542 Cervicalgia: Secondary | ICD-10-CM

## 2014-11-15 DIAGNOSIS — R6889 Other general symptoms and signs: Secondary | ICD-10-CM

## 2014-11-15 NOTE — Therapy (Signed)
[place], [place], [address] Phone: [phone]   Fax:  [phone]  Physical Therapy Treatment  Patient Details  Name: Gene Glover MRN: [id number] Date of Birth: Jun 12, 1955 Referring Provider:  Mcarthur Rossetti*  Encounter Date: 11/15/2014      PT End of Session - 11/15/14 1314    Visit Number 10   Number of Visits 16   Date for PT Re-Evaluation 11/25/14   PT Start Time 3244   PT Stop Time 1312   PT Time Calculation (min) 30 min   Activity Tolerance Patient tolerated treatment well   Behavior During Therapy [facility] for tasks assessed/performed      No past medical history on file.  No past surgical history on file.  There were no vitals filed for this visit.  Visit Diagnosis:  Knee pain, acute, left  Neck pain, acute  Muscle spasms of neck  Activity intolerance      Subjective Assessment - 11/15/14 1244    Subjective Doing alright. No problems after last visit   Currently in Pain? No/denies                         [facility] Adult PT Treatment/Exercise - 11/15/14 1245    Therapeutic Activites    Lifting Lifting mechanics reviewed and he was cued while lifting and carrying He is much better with mechanics. @ handed lifting from waist to floor level then carried  2 handed  100 ft. up to 50 pounds without pain . Carry one handed 20-30--40-50 pounds 50 feet  RT and LT handed. Carried 30 pounds up and down 6 inch steps 16 steps without pain.        Nustep 5 min L7 UE and LE              PT Short Term Goals - 11/15/14 1317    PT SHORT TERM GOAL #1   Title He will be independent with initial HEP   Status Achieved   PT SHORT TERM GOAL #2   Title He will report pain decreased 40% or more in knee with sitting /driving   Status Achieved   PT SHORT TERM GOAL #3   Title He will report neck pain improved 40% with normal activity in day   Status Achieved   PT  SHORT TERM GOAL #4   Title He will improve active neck rotation  to 60 degrees bilaterally   Status On-going   PT SHORT TERM GOAL #5   Title Demonstrate postural awareness   Status Achieved           PT Long Term Goals - 11/15/14 1317    PT LONG TERM GOAL #1   Title He will be independent with alll HEP issued as of last visit   Status Achieved   PT LONG TERM GOAL #2   Title He will report 75% or more decr pain in neck with work activity   Status Achieved   PT LONG TERM GOAL #3   Title He will report 75% decre pain in knee with driving   Status Achieved   PT LONG TERM GOAL #4   Title He will improve neck motion to WNL with 1-2 max pain.    Status Unable to assess   PT LONG TERM GOAL #6   Title He will be able to simulate work activity without pain   Status Partially Met  Plan - 11/15/14 1315    Clinical Impression Statement No pain with activity., He is lifting weight at aprroximately the level he needs to for work without pain. We will increase load to 50 pounds for single hand carry and do this 34-x twice in session if able and walk up and down stairs  30 steps if able .    PT Next Visit Plan Continue strength and continue work simulation activity, add weight or reps if not sore from today's activity   Consulted and Agree with Plan of Care Patient        Problem List There are no active problems to display for this patient.   Darrel Hoover PT 11/15/2014, 1:19 PM  [facility] [address] [place], [place], [address] Phone: [phone]   Fax:  [phone]

## 2014-11-17 ENCOUNTER — Ambulatory Visit: Payer: Self-pay

## 2014-11-17 DIAGNOSIS — M25562 Pain in left knee: Secondary | ICD-10-CM

## 2014-11-17 DIAGNOSIS — R6889 Other general symptoms and signs: Secondary | ICD-10-CM

## 2014-11-17 DIAGNOSIS — M542 Cervicalgia: Secondary | ICD-10-CM

## 2014-11-17 DIAGNOSIS — M62838 Other muscle spasm: Secondary | ICD-10-CM

## 2014-11-17 NOTE — Therapy (Signed)
[place], [place], [address] Phone: [phone]   Fax:  [phone]  Physical Therapy Treatment  Patient Details  Name: Gene Glover MRN: [id number] Date of Birth: 1955-09-09 Referring Provider:  Mcarthur Rossetti*  Encounter Date: 11/17/2014      PT End of Session - 11/17/14 1304    Visit Number 11   Number of Visits 16   Date for PT Re-Evaluation 11/25/14   PT Start Time 1225   PT Stop Time 1255   PT Time Calculation (min) 30 min   Activity Tolerance Patient tolerated treatment well   Behavior During Therapy [facility] for tasks assessed/performed      No past medical history on file.  No past surgical history on file.  There were no vitals filed for this visit.  Visit Diagnosis:  Knee pain, acute, left  Neck pain, acute  Muscle spasms of neck  Activity intolerance      Subjective Assessment - 11/17/14 1258    Subjective No pain after last session and none now   Currently in Pain? No/denies                         [facility] Adult PT Treatment/Exercise - 11/17/14 1259    Therapeutic Activites    Lifting Lifting mechanics reviewed and he was cued while lifting and carrying He is much better with mechanics. 2 handed lifting from waist to floor level then carried  2 handed  100 ft. up to 55 pounds without pain . Carry one handed 30--40-50 pounds 50 feet  RT and LT handed. Carried 30-45-55 pounds up and down 6 inch steps .40 steps with 45 and 55 pounds without pain. He noted  some strain in LT quad with carrying 50 pounds 30 steps one handed up and down. changing hands to RT with less LT quad strain , No pain   Knee/Hip Exercises: Aerobic   Nustep LE and UE x 8 min L8                  PT Short Term Goals - 11/15/14 1317    PT SHORT TERM GOAL #1   Title He will be independent with initial HEP   Status Achieved   PT SHORT TERM GOAL #2   Title He will report pain  decreased 40% or more in knee with sitting /driving   Status Achieved   PT SHORT TERM GOAL #3   Title He will report neck pain improved 40% with normal activity in day   Status Achieved   PT SHORT TERM GOAL #4   Title He will improve active neck rotation  to 60 degrees bilaterally   Status On-going   PT SHORT TERM GOAL #5   Title Demonstrate postural awareness   Status Achieved           PT Long Term Goals - 11/15/14 1317    PT LONG TERM GOAL #1   Title He will be independent with alll HEP issued as of last visit   Status Achieved   PT LONG TERM GOAL #2   Title He will report 75% or more decr pain in neck with work activity   Status Achieved   PT LONG TERM GOAL #3   Title He will report 75% decre pain in knee with driving   Status Achieved   PT LONG TERM GOAL #4   Title He will improve neck motion to  WNL with 1-2 max pain.    Status Unable to assess   PT LONG TERM GOAL #6   Title He will be able to simulate work activity without pain   Status Partially Met               Plan - 11/17/14 1304    Clinical Impression Statement No pain post session He reports LT quad fine at end. He feels he needs more conditioning to successively return to work. We will increase weight or reps.    PT Next Visit Plan Increase weight to 55-60 pounds and or increase stairs to 3-4 sets of 20 steps, NECK ROM,  FOTO   Consulted and Agree with Plan of Care Patient        Problem List There are no active problems to display for this patient.   Darrel Hoover PT 11/17/2014, 1:07 PM  [id number] [address] [place], [place], [address] Phone: [phone]   Fax:  [phone]

## 2014-11-23 ENCOUNTER — Ambulatory Visit: Payer: Self-pay

## 2014-11-23 DIAGNOSIS — R6889 Other general symptoms and signs: Secondary | ICD-10-CM

## 2014-11-23 DIAGNOSIS — M542 Cervicalgia: Secondary | ICD-10-CM

## 2014-11-23 DIAGNOSIS — M25562 Pain in left knee: Secondary | ICD-10-CM

## 2014-11-23 NOTE — Therapy (Signed)
[place], [place], [address] Phone: [phone]   Fax:  [phone]  Physical Therapy Treatment  Patient Details  Name: Gene Glover MRN: [id number] Date of Birth: 1955/03/30 No Data Recorded  Encounter Date: 11/23/2014      PT End of Session - 11/23/14 1329    Visit Number 12   Number of Visits 16   Date for PT Re-Evaluation 11/25/14   PT Start Time 1232   PT Stop Time 1310   PT Time Calculation (min) 38 min   Activity Tolerance Patient tolerated treatment well   Behavior During Therapy [facility] for tasks assessed/performed      No past medical history on file.  No past surgical history on file.  There were no vitals filed for this visit.  Visit Diagnosis:  Neck pain, acute  Knee pain, acute, left  Activity intolerance      Subjective Assessment - 11/23/14 1325    Subjective No pain . Carried 3 full buckets of urethane yesterday on levle surface   Currently in Pain? No/denies                         [facility] Adult PT Treatment/Exercise - 11/23/14 1326    Therapeutic Activites    Lifting He is much better with mechanics. 2 handed lifting from waist to floor level then carried  2 handed  100 ft. up to 55 pounds without pain . Carry one handed 40-50 pounds 50 feet  RT and LT handed. Carried 45-55 pounds up and down 6 inch steps .40 steps with 45 and 55 pounds without pain. He did not report any  strain in LT quad with carrying 50 pounds 30 steps one handed up and down.  , No pain.  2 sets of 40 1 and 2 handed carrying on steps   Knee/Hip Exercises: Aerobic   Nustep LE and UE x 8 min L8                  PT Short Term Goals - 11/15/14 1317    PT SHORT TERM GOAL #1   Title He will be independent with initial HEP   Status Achieved   PT SHORT TERM GOAL #2   Title He will report pain decreased 40% or more in knee with sitting /driving   Status Achieved   PT SHORT TERM  GOAL #3   Title He will report neck pain improved 40% with normal activity in day   Status Achieved   PT SHORT TERM GOAL #4   Title He will improve active neck rotation  to 60 degrees bilaterally   Status On-going   PT SHORT TERM GOAL #5   Title Demonstrate postural awareness   Status Achieved           PT Long Term Goals - 11/15/14 1317    PT LONG TERM GOAL #1   Title He will be independent with alll HEP issued as of last visit   Status Achieved   PT LONG TERM GOAL #2   Title He will report 75% or more decr pain in neck with work activity   Status Achieved   PT LONG TERM GOAL #3   Title He will report 75% decre pain in knee with driving   Status Achieved   PT LONG TERM GOAL #4   Title He will improve neck motion to WNL with 1-2 max pain.  Status Unable to assess   PT LONG TERM GOAL #6   Title He will be able to simulate work activity without pain   Status Partially Met               Plan - 11/23/14 1329    Clinical Impression Statement No pain post session . Begining to return to physical part of work duties   PT Next Visit Plan Increase reps next,        Consulted and Agree with Plan of Care Patient        Problem List There are no active problems to display for this patient.   Darrel Hoover PT 11/23/2014, 1:31 PM  [facility] [address] [place], [place], [address] Phone: [phone]   Fax:  [phone]  Name: Gene Glover MRN: [id number] Date of Birth: 08/21/1955

## 2014-11-25 ENCOUNTER — Ambulatory Visit: Payer: No Typology Code available for payment source

## 2014-12-07 ENCOUNTER — Ambulatory Visit: Payer: No Typology Code available for payment source | Attending: Orthopaedic Surgery

## 2014-12-07 DIAGNOSIS — M542 Cervicalgia: Secondary | ICD-10-CM | POA: Insufficient documentation

## 2014-12-07 DIAGNOSIS — M25562 Pain in left knee: Secondary | ICD-10-CM | POA: Insufficient documentation

## 2014-12-07 DIAGNOSIS — R6889 Other general symptoms and signs: Secondary | ICD-10-CM | POA: Insufficient documentation

## 2014-12-09 ENCOUNTER — Ambulatory Visit: Payer: No Typology Code available for payment source

## 2014-12-09 DIAGNOSIS — M25562 Pain in left knee: Secondary | ICD-10-CM | POA: Diagnosis present

## 2014-12-09 DIAGNOSIS — M542 Cervicalgia: Secondary | ICD-10-CM

## 2014-12-09 DIAGNOSIS — R6889 Other general symptoms and signs: Secondary | ICD-10-CM | POA: Diagnosis present

## 2014-12-09 NOTE — Therapy (Signed)
[place], [place], [address] Phone: [phone]   Fax:  [phone]  Physical Therapy Treatment  Patient Details  Name: Gene Glover MRN: [id number] Date of Birth: 09-25-1955 No Data Recorded  Encounter Date: 12/09/2014      PT End of Session - 12/09/14 1007    Visit Number 13   Number of Visits 16   PT Start Time 0930   PT Stop Time 1008   PT Time Calculation (min) 38 min   Activity Tolerance Patient tolerated treatment well   Behavior During Therapy [facility] for tasks assessed/performed      No past medical history on file.  No past surgical history on file.  There were no vitals filed for this visit.  Visit Diagnosis:  Neck pain, acute  Knee pain, acute, left  Activity intolerance      Subjective Assessment - 12/09/14 0933    Subjective (p) Had to take long trip into [place] and [place] and my leg did fine for trip in [place].    Currently in Pain? (p) No/denies            [facility] PT Assessment - 12/09/14 0001    AROM   Cervical - Right Rotation 60   Cervical - Left Rotation 60   Strength   Overall Strength Comments LE normal without pain                     [facility] Adult PT Treatment/Exercise - 12/09/14 0931    Therapeutic Activites    Lifting . 2 handed lifting from waist to floor level then carried  2 handed  100 ft. up to 55 pounds without pain . Carry one handed 50 pounds 50 feet  RT and LT handed. Carried 55 pounds up and down 6 inch steps .50 steps with 45 and 55 pounds without pain. He did not report any  strain in LT quad with carrying 50 pounds 50 steps one handed up and down.  , No pain.  2 sets of 50 1 and 2 handed carrying on steps   Knee/Hip Exercises: Aerobic   Nustep LE and UE x 6 min L6                  PT Short Term Goals - 12/09/14 1011    PT SHORT TERM GOAL #1   Title He will be independent with initial HEP   Status Achieved   PT SHORT TERM GOAL #2    Title He will report pain decreased 40% or more in knee with sitting /driving   Status Achieved   PT SHORT TERM GOAL #3   Title He will report neck pain improved 40% with normal activity in day   Status Achieved   PT SHORT TERM GOAL #4   Title He will improve active neck rotation  to 60 degrees bilaterally   Status Achieved   PT SHORT TERM GOAL #5   Title Demonstrate postural awareness   Status Achieved           PT Long Term Goals - 12/09/14 1011    PT LONG TERM GOAL #2   Title He will report 75% or more decr pain in neck with work activity   Status Achieved   PT LONG TERM GOAL #3   Title He will report 75% decre pain in knee with driving   Status Achieved   PT LONG TERM GOAL #4   Title He  will improve neck motion to WNL with 1-2 max pain.    Status Achieved   PT LONG TERM GOAL #5   Title He will report no headaches and no shooting pain in neck   Status Achieved   PT LONG TERM GOAL #6   Title He will be able to simulate work activity without pain   Status Achieved               Plan - 12/09/14 1008    Clinical Impression Statement No pain. tolerated heavy weight with carrying and stairs. 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 I suggested he needed to continue to to the heavier work to maintain a level for strength and fitness .  He had a episode of knee instability but his feet ar much longer than the steps probably shifted weight posterior to knee . He had no pain with this. He is ready for discharge we have and   PT Next Visit Plan Discharge with HEP   Consulted and Agree with Plan of Care Patient        Problem List There are no active problems to display for this patient.   Darrel Hoover PT 12/09/2014, 10:17 AM  [facility] [address] [place], [place], [address] Phone: [phone]   Fax:  [phone]  Name: Gene Glover MRN: [id number] Date of Birth: 1955/07/13   PHYSICAL  THERAPY DISCHARGE SUMMARY  Visits from Start of Care: 13  Current functional level related to goals / functional outcomes: See above   Remaining deficits: None   Education / Equipment: HEP  Plan: Patient agrees to discharge.  Patient goals were met. Patient is being discharged due to meeting the stated rehab goals.  ?????
